# Patient Record
Sex: Female | Born: 1995 | Race: Black or African American | Hispanic: No | Marital: Single | Smoking: Never smoker
Health system: Southern US, Community
[De-identification: ages and names within clinical notes are randomized; demographics above are authoritative.]

## PROBLEM LIST (undated history)

## (undated) DIAGNOSIS — G43909 Migraine, unspecified, not intractable, without status migrainosus: Secondary | ICD-10-CM

## (undated) DIAGNOSIS — Z8639 Personal history of other endocrine, nutritional and metabolic disease: Secondary | ICD-10-CM

## (undated) HISTORY — DX: Personal history of other endocrine, nutritional and metabolic disease: Z86.39

---
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
## 2018-05-05 IMAGING — CR DG CHEST 2V
2 series · 2 of 2 positions shown · non-contrast
Comparison: None.

CLINICAL DATA: Acute onset of generalized chest pain and syncope.
Initial encounter.

EXAM:
CHEST  2 VIEW

[w chest pa]
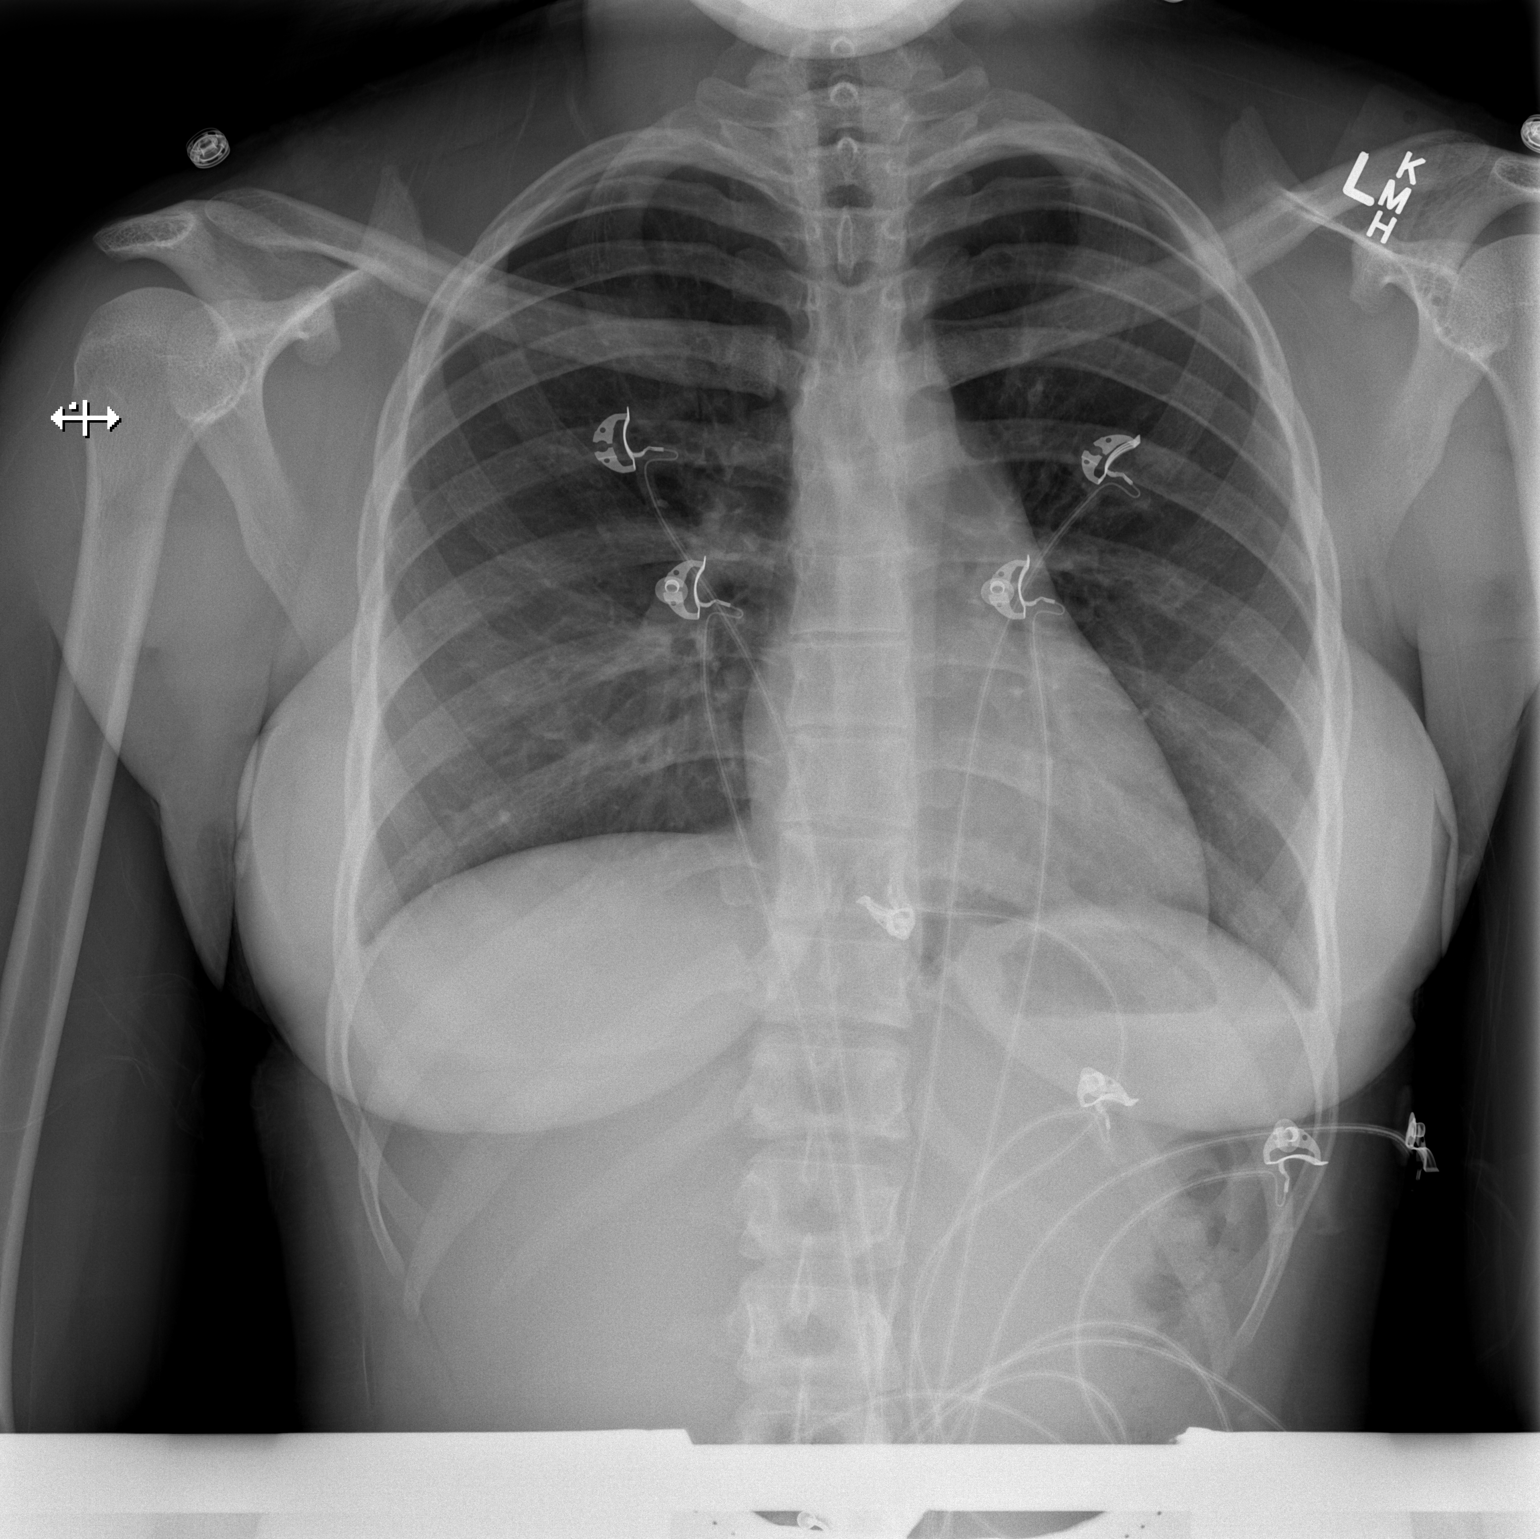

[w chest lat]
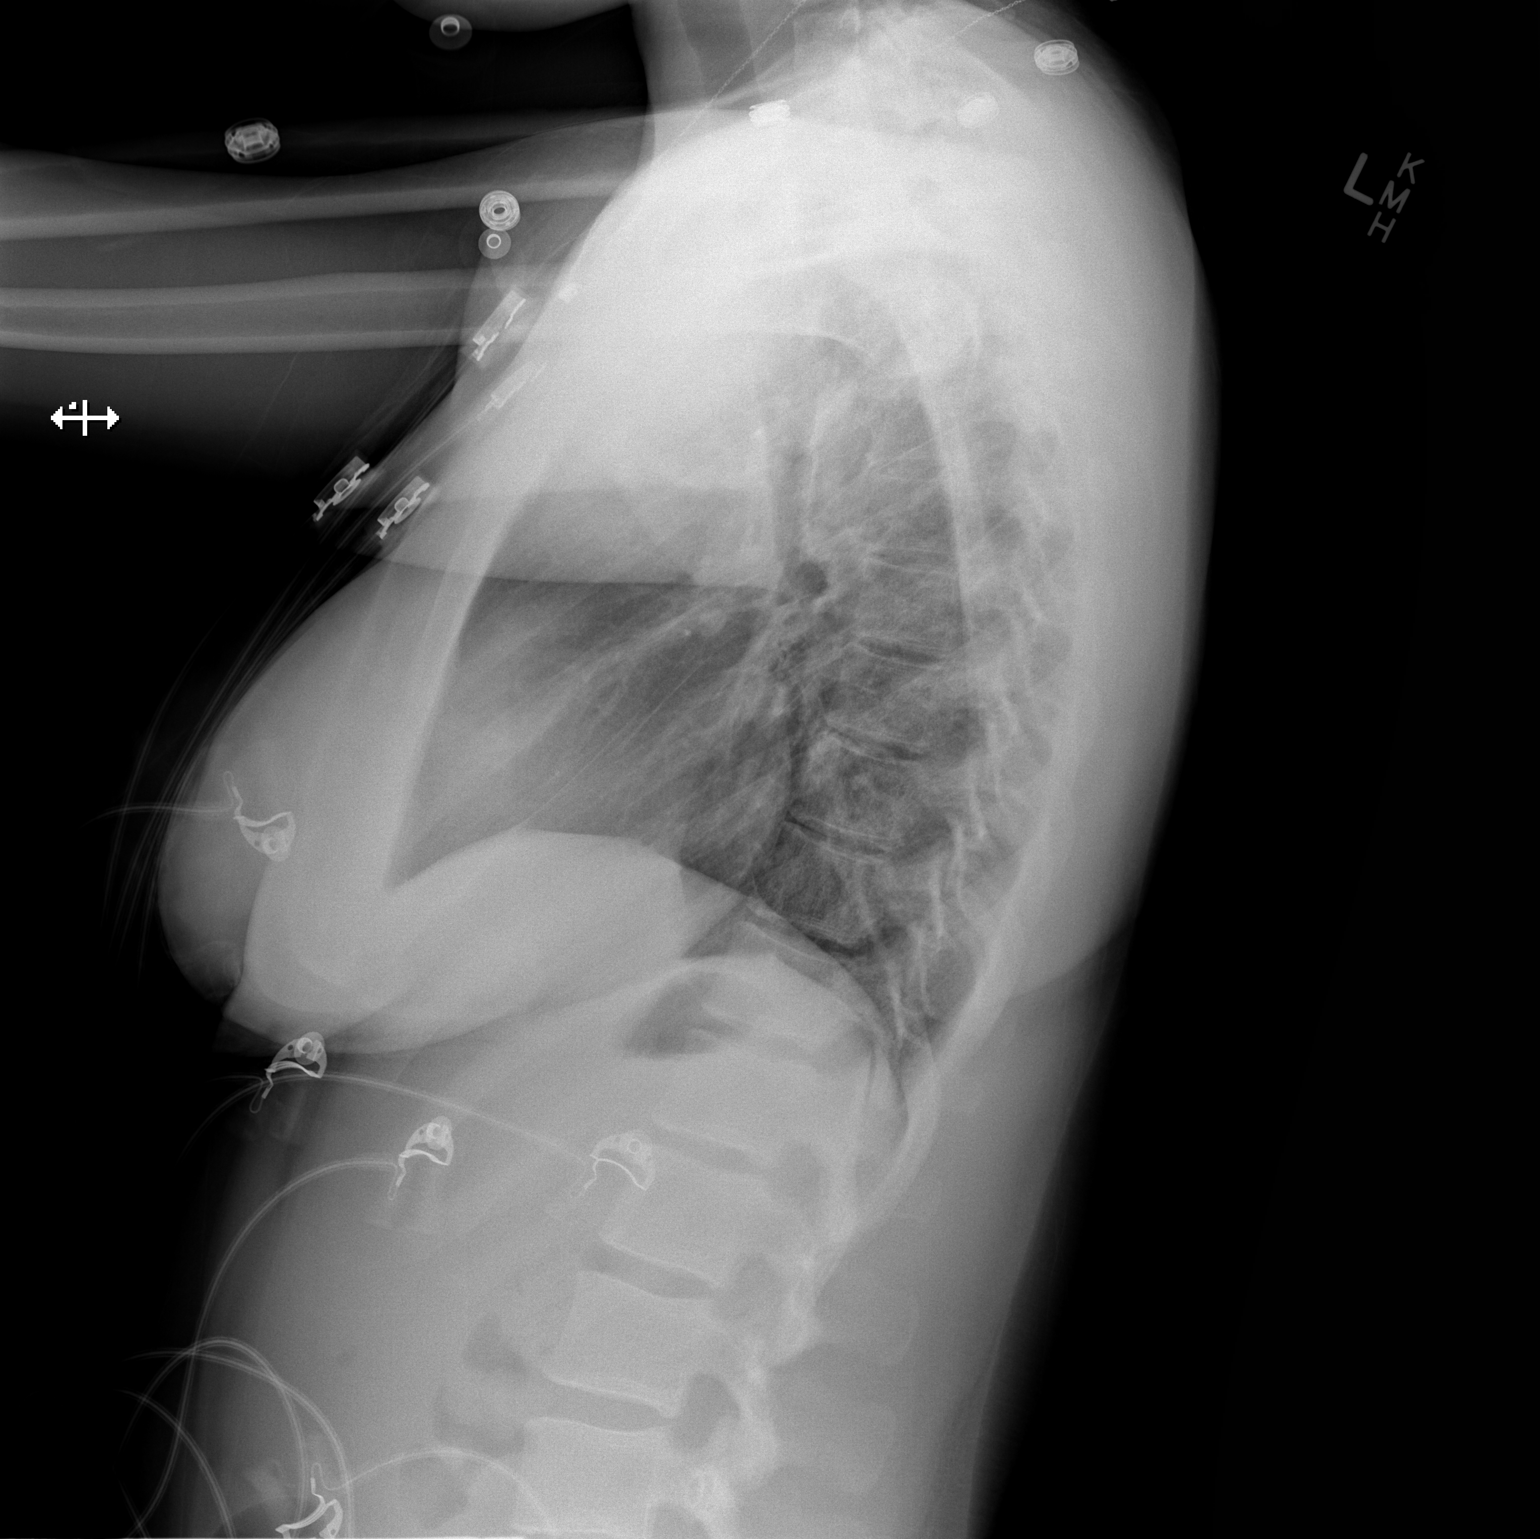

[2 of 2 positions shown; findings below may reference images not displayed]

FINDINGS: The lungs are well-aerated and clear. There is no evidence of focal
opacification, pleural effusion or pneumothorax.

The heart is normal in size; the mediastinal contour is within
normal limits. No acute osseous abnormalities are seen.
IMPRESSION: No acute cardiopulmonary process seen. No displaced rib fractures
identified.
